# Patient Record
Sex: Female | Born: 1980 | Race: Black or African American | Hispanic: No | Marital: Single | Smoking: Never smoker
Health system: Southern US, Community
[De-identification: ages and names within clinical notes are randomized; demographics above are authoritative.]

## PROBLEM LIST (undated history)

## (undated) HISTORY — PX: TUBAL LIGATION: SHX77

---

## 2010-06-04 ENCOUNTER — Inpatient Hospital Stay (HOSPITAL_COMMUNITY)
Admission: AD | Admit: 2010-06-04 | Discharge: 2010-06-04 | Disposition: A | Discharge status: Home / Self Care | Payer: Self-pay | Source: Ambulatory Visit | Attending: Family Medicine | Admitting: Family Medicine

## 2010-06-04 DIAGNOSIS — N390 Urinary tract infection, site not specified: Secondary | ICD-10-CM

## 2010-06-04 DIAGNOSIS — B373 Candidiasis of vulva and vagina: Secondary | ICD-10-CM

## 2010-06-04 DIAGNOSIS — N949 Unspecified condition associated with female genital organs and menstrual cycle: Secondary | ICD-10-CM | POA: Insufficient documentation

## 2010-06-04 DIAGNOSIS — B3731 Acute candidiasis of vulva and vagina: Secondary | ICD-10-CM | POA: Insufficient documentation

## 2010-06-04 LAB — URINALYSIS, ROUTINE W REFLEX MICROSCOPIC
Bilirubin Urine: NEGATIVE
Specific Gravity, Urine: 1.01 (ref 1.005–1.030)
pH: 7.5 (ref 5.0–8.0)

## 2010-06-04 LAB — URINE MICROSCOPIC-ADD ON

## 2010-06-04 LAB — WET PREP, GENITAL: Trich, Wet Prep: NONE SEEN

## 2010-06-06 LAB — URINE CULTURE: Colony Count: >=100000

## 2012-01-10 ENCOUNTER — Emergency Department (HOSPITAL_COMMUNITY)
Admission: EM | Admit: 2012-01-10 | Discharge: 2012-01-11 | Disposition: A | Discharge status: Home / Self Care | Payer: Self-pay | Attending: Emergency Medicine | Admitting: Emergency Medicine

## 2012-01-10 ENCOUNTER — Encounter (HOSPITAL_COMMUNITY): Payer: Self-pay | Admitting: *Deleted

## 2012-01-10 DIAGNOSIS — R42 Dizziness and giddiness: Secondary | ICD-10-CM | POA: Insufficient documentation

## 2012-01-10 LAB — GLUCOSE, CAPILLARY: Glucose-Capillary: 108 mg/dL — ABNORMAL HIGH (ref 70–99)

## 2012-01-10 NOTE — ED Notes (Signed)
The pt has had dizziness and nausea since yesterday.  Headache since yesterday afternoone

## 2012-01-10 NOTE — ED Notes (Signed)
Patient complaining of dizziness upon waking up at 2am yesterday.  Patient reports that dizziness has been intermittent throughout the day, especially after she lies down and gets up.  Patient's family member gave patient over the counter motion sickness medication, which provided little to no relief.  Reports nausea; denies vomiting.  Denies shortness of breath, chest pain, and unsteady gait.  Patient speaks little [place]; family member provided history; interpreter phones at bedside.  Patient alert and oriented x4; PERRL present.  Will continue to monitor.

## 2012-01-11 MED ORDER — MECLIZINE HCL 25 MG PO TABS
50.0000 mg | ORAL_TABLET | Freq: Once | ORAL | Status: AC
  Administered 2012-01-11: 50 mg via ORAL
  Filled 2012-01-11: qty 2

## 2012-01-11 MED ORDER — MECLIZINE HCL 50 MG PO TABS: 50.0000 mg | ORAL_TABLET | Freq: Three times a day (TID) | ORAL | Status: DC | PRN

## 2012-01-11 MED ORDER — MECLIZINE HCL 12.5 MG PO TABS: 50.0000 mg | ORAL_TABLET | Freq: Three times a day (TID) | ORAL | Status: DC | PRN

## 2012-01-11 NOTE — ED Provider Notes (Addendum)
History     CSN: [id number]  Arrival date & time 01/10/12  1945   First MD Initiated Contact with Patient 01/11/12 0013      Chief Complaint  Patient presents with  . Dizziness   Patient speaks no English history is obtained from patient using [employer] interpreter line (Consider location/radiation/quality/duration/timing/severity/associated sxs/prior treatment) HPI Patient developed dizziness i.e. feeling of room spinning onset 2 PM yesterday. Symptoms intermittent lasting a few seconds at a time Symptoms worse with moving had improved with remaining still. No other associated symptoms no nausea no chest pain no headache. Treated with over-the-counter motion sickness medicine this afternoon with minimal relief. History reviewed. No pertinent past medical history. Past medical history negative History reviewed. No pertinent past surgical history.  No family history on file.  History  Substance Use Topics  . Smoking status: Never Smoker   . Smokeless tobacco: Not on file  . Alcohol Use: No    OB History    Grav Para Term Preterm Abortions TAB SAB Ect Mult Living                  Review of Systems  Constitutional: Negative.   HENT: Negative.   Respiratory: Negative.   Cardiovascular: Negative.   Gastrointestinal: Negative.   Musculoskeletal: Negative.   Skin: Negative.   Neurological: Positive for dizziness.  Hematological: Negative.   Psychiatric/Behavioral: Negative.   All other systems reviewed and are negative.    Allergies  Review of patient's allergies indicates no known allergies.  Home Medications  No current outpatient prescriptions on file.  BP 104/70  Pulse 74  Temp 98.4 F (36.9 C) (Oral)  Resp 20  SpO2 100%  LMP 12/24/2011  Physical Exam  Nursing note and vitals reviewed. Constitutional: She appears well-developed and well-nourished.  HENT:  Head: Normocephalic and atraumatic.  Eyes: Conjunctivae normal are normal. Pupils are equal,  round, and reactive to light.  Neck: Neck supple. No tracheal deviation present. No thyromegaly present.  Cardiovascular: Normal rate and regular rhythm.   No murmur heard. Pulmonary/Chest: Effort normal and breath sounds normal.  Abdominal: Soft. Bowel sounds are normal. She exhibits no distension. There is no tenderness.  Musculoskeletal: Normal range of motion. She exhibits no edema and no tenderness.  Neurological: She is alert. She has normal reflexes. Coordination normal.       BeComes vertiginous upon standing lasting a few seconds. Symptoms resolved after she stares and affixed point. Gait normal Romberg normal pronator drift normal  Skin: Skin is warm and dry. No rash noted.  Psychiatric: She has a normal mood and affect.    ED Course  Procedures (including critical care time)  Labs Reviewed  GLUCOSE, CAPILLARY - Abnormal; Notable for the following:    Glucose-Capillary 108 (*)     All other components within normal limits   No results found.   No diagnosis found.   Date: 01/11/2012  Rate: 80  Rhythm: normal sinus rhythm  QRS Axis: normal  Intervals: normal  ST/T Wave abnormalities: normal  Conduction Disutrbances: none  Narrative Interpretation: unremarkable   Results for orders placed during the hospital encounter of 01/10/12  GLUCOSE, CAPILLARY      Component Value Range   Glucose-Capillary 108 (*) 70 - 99 mg/dL   No results found.   MDM  History and exam is consistent with benign positional vertigo Plan prescription meclizine. ENT referral when necessary one week Diagnosis vertigo        Doug Sou, MD 01/11/12 0110  Doug Sou, MD 01/11/12 0110

## 2012-01-11 NOTE — ED Notes (Signed)
EDP at bedside with interpreter phones.

## 2012-01-11 NOTE — ED Notes (Signed)
Patient currently resting quietly in bed; no respiratory or acute distress noted.  Patient updated on plan of care; informed patient that we are currently waiting on disposition from EDP. Denies needs at this time; will continue to monitor.

## 2013-11-01 ENCOUNTER — Ambulatory Visit (INDEPENDENT_AMBULATORY_CARE_PROVIDER_SITE_OTHER): Payer: 59 | Admitting: Physician Assistant

## 2013-11-01 VITALS — BP 100/60 | HR 86 | Temp 97.9°F | Resp 16 | Ht 66.0 in | Wt 133.0 lb

## 2013-11-01 DIAGNOSIS — J302 Other seasonal allergic rhinitis: Secondary | ICD-10-CM

## 2013-11-01 MED ORDER — FLUTICASONE PROPIONATE 50 MCG/ACT NA SUSP: 2.0000 | Freq: Every day | NASAL | Status: DC

## 2013-11-01 MED ORDER — LORATADINE 10 MG PO TABS: 10.0000 mg | ORAL_TABLET | Freq: Every day | ORAL | Status: DC

## 2013-11-01 NOTE — Progress Notes (Signed)
     IDENTIFYING INFORMATION  Roberta Drake / female / 12/25/1980 / 33 y.o. / MRN: [id number]  The patient has no on her problem list.  SUBJECTIVE  Roberta Drake complains of Facial Pain.  HPI  Her facial pain started five days ago. This started in her nose with congestion and then spread, causing HA which is bilateral and constant in nature, and ear pressure.  She reports her pain at 8/10 and describes is as sharp.  She has tried 2 Advil in 24 hours, and it gives her relief for about 24 hours.  She reports itchy eys, has had minimal sneezing, and denies sore throat. She reports tactile fever.  She has never tried pseudoephedrine, Claritin, Flonase, or Zyrtec.      The patient has a current medication list which includes the following prescription(s): ibuprofen  Roberta Drake has No Known Allergies. and she  reports that she has never smoked. She reports that she does not drink alcohol or use illicit drugs.   The patient  has past surgical history that includes Oophorectomy. and her family is healthy to her knowledge.    Review of Systems  Constitutional: Positive for fever.  HENT: Positive for ear pain (pressure).   Respiratory: Negative.   Cardiovascular: Negative.   Gastrointestinal: Negative.   Genitourinary: Negative.   Musculoskeletal: Negative.   Skin: Negative.   Neurological: Positive for headaches.    OBJECTIVE  Blood pressure 100/60, pulse 86, temperature 97.9 F (36.6 C), temperature source Oral, resp. rate 16, height 5\' 6"  (1.676 m), weight 133 lb (60.328 kg), last menstrual period 10/17/2013, SpO2 100.00%.  Physical Exam  Constitutional: She is oriented to person, place, and time and well-developed, well-nourished, and in no distress.  HENT:  Head: Normocephalic.  Right Ear: Hearing, tympanic membrane, external ear and ear canal normal.  Left Ear: Hearing, tympanic membrane, external ear and ear canal normal.  Nose: Mucosal edema (L>R) and sinus tenderness  present. No rhinorrhea or nasal deformity. Right sinus exhibits maxillary sinus tenderness and frontal sinus tenderness. Left sinus exhibits maxillary sinus tenderness and frontal sinus tenderness.  Mouth/Throat: Uvula is midline and mucous membranes are normal. Posterior oropharyngeal erythema (Mild) present.  Neck: Normal range of motion.  Cardiovascular: Normal rate, regular rhythm and normal heart sounds.   Pulmonary/Chest: Effort normal and breath sounds normal.  Lymphadenopathy:       Head (right side): No submental, no submandibular, no tonsillar, no preauricular, no posterior auricular and no occipital adenopathy present.       Head (left side): No submental, no submandibular, no tonsillar, no preauricular, no posterior auricular and no occipital adenopathy present.    She has no cervical adenopathy.  Neurological: She is alert and oriented to person, place, and time.  Skin: Skin is warm and dry. She is not diaphoretic.  Psychiatric: Mood, memory, affect and judgment normal.    ASSESSMENT & PLAN  Seasonal allergies - Plan: fluticasone (FLONASE) 50 MCG/ACT nasal spray, loratadine (CLARITIN) 10 MG tablet.  Patient educated on the cause of seasonal allergies.  Specific instruction given regarding fluticasone administration, and that it is a preventative medication designed to be taken prophylactically. Advised a 7 days course of Claritin and Advil, as prescribed.      The patient was instructed to to call or comeback to clinic as needed, or should symptoms warrant.  Deliah BostonMichael Clark, MS, PA-C [facility] 11/01/2013 10:15 AM

## 2013-11-01 NOTE — Patient Instructions (Signed)
Take two Advil in the morning, and two Advil at night.

## 2013-12-21 ENCOUNTER — Ambulatory Visit (INDEPENDENT_AMBULATORY_CARE_PROVIDER_SITE_OTHER): Payer: 59 | Admitting: Family Medicine

## 2013-12-21 ENCOUNTER — Encounter: Payer: Self-pay | Admitting: Family Medicine

## 2013-12-21 VITALS — BP 106/72 | HR 87 | Temp 98.2°F | Resp 16 | Ht 65.0 in | Wt 129.2 lb

## 2013-12-21 DIAGNOSIS — R8290 Unspecified abnormal findings in urine: Secondary | ICD-10-CM

## 2013-12-21 DIAGNOSIS — N644 Mastodynia: Secondary | ICD-10-CM

## 2013-12-21 DIAGNOSIS — N912 Amenorrhea, unspecified: Secondary | ICD-10-CM

## 2013-12-21 DIAGNOSIS — A499 Bacterial infection, unspecified: Secondary | ICD-10-CM

## 2013-12-21 DIAGNOSIS — N760 Acute vaginitis: Secondary | ICD-10-CM

## 2013-12-21 DIAGNOSIS — R5383 Other fatigue: Secondary | ICD-10-CM

## 2013-12-21 DIAGNOSIS — R1031 Right lower quadrant pain: Secondary | ICD-10-CM

## 2013-12-21 DIAGNOSIS — B9689 Other specified bacterial agents as the cause of diseases classified elsewhere: Secondary | ICD-10-CM

## 2013-12-21 DIAGNOSIS — Z3169 Encounter for other general counseling and advice on procreation: Secondary | ICD-10-CM

## 2013-12-21 DIAGNOSIS — Z124 Encounter for screening for malignant neoplasm of cervix: Secondary | ICD-10-CM

## 2013-12-21 DIAGNOSIS — R1032 Left lower quadrant pain: Secondary | ICD-10-CM

## 2013-12-21 LAB — POCT WET PREP WITH KOH
Clue Cells Wet Prep HPF POC: 50
KOH Prep POC: NEGATIVE
Trichomonas, UA: NEGATIVE
Yeast Wet Prep HPF POC: NEGATIVE

## 2013-12-21 LAB — CBC
HCT: 37.6 % (ref 36.0–46.0)
Hemoglobin: 13.1 g/dL (ref 12.0–15.0)
MCH: 32.6 pg (ref 26.0–34.0)
MCHC: 34.8 g/dL (ref 30.0–36.0)
MCV: 93.5 fL (ref 78.0–100.0)
MPV: 10.3 fL (ref 9.4–12.4)
Platelets: 402 10*3/uL — ABNORMAL HIGH (ref 150–400)
RBC: 4.02 MIL/uL (ref 3.87–5.11)
RDW: 13.8 % (ref 11.5–15.5)
WBC: 5 10*3/uL (ref 4.0–10.5)

## 2013-12-21 LAB — POCT URINALYSIS DIPSTICK
Bilirubin, UA: NEGATIVE
Glucose, UA: NEGATIVE
Ketones, UA: NEGATIVE
Nitrite, UA: NEGATIVE
Protein, UA: NEGATIVE
Spec Grav, UA: <=1.005
Urobilinogen, UA: 0.2
pH, UA: 6

## 2013-12-21 LAB — POCT UA - MICROSCOPIC ONLY
Casts, Ur, LPF, POC: NEGATIVE
Crystals, Ur, HPF, POC: NEGATIVE
Mucus, UA: NEGATIVE
Yeast, UA: NEGATIVE

## 2013-12-21 LAB — COMPLETE METABOLIC PANEL WITHOUT GFR
Albumin: 4.2 g/dL (ref 3.5–5.2)
Alkaline Phosphatase: 64 U/L (ref 39–117)
BUN: 7 mg/dL (ref 6–23)
CO2: 29 meq/L (ref 19–32)
Calcium: 9.5 mg/dL (ref 8.4–10.5)
Chloride: 105 meq/L (ref 96–112)
GFR, Est Non African American: >89 mL/min
Glucose, Bld: 108 mg/dL — ABNORMAL HIGH (ref 70–99)
Potassium: 3.8 meq/L (ref 3.5–5.3)
Sodium: 139 meq/L (ref 135–145)
Total Protein: 7.1 g/dL (ref 6.0–8.3)

## 2013-12-21 LAB — COMPLETE METABOLIC PANEL WITH GFR
ALT: 14 U/L (ref 0–35)
AST: 18 U/L (ref 0–37)
Creat: 0.72 mg/dL (ref 0.50–1.10)
GFR, Est African American: >89 mL/min
Total Bilirubin: 0.5 mg/dL (ref 0.2–1.2)

## 2013-12-21 LAB — POCT URINE PREGNANCY: Preg Test, Ur: NEGATIVE

## 2013-12-21 MED ORDER — METRONIDAZOLE 500 MG PO TABS: 500.0000 mg | ORAL_TABLET | Freq: Two times a day (BID) | ORAL | Status: DC

## 2013-12-21 NOTE — Patient Instructions (Signed)
Bacterial Vaginosis Bacterial vaginosis is a vaginal infection that occurs when the normal balance of bacteria in the vagina is disrupted. It results from an overgrowth of certain bacteria. This is the most common vaginal infection in women of childbearing age. Treatment is important to prevent complications, especially in pregnant women, as it can cause a premature delivery. CAUSES  Bacterial vaginosis is caused by an increase in harmful bacteria that are normally present in smaller amounts in the vagina. Several different kinds of bacteria can cause bacterial vaginosis. However, the reason that the condition develops is not fully understood. RISK FACTORS Certain activities or behaviors can put you at an increased risk of developing bacterial vaginosis, including:  Having a new sex partner or multiple sex partners.  Douching.  Using an intrauterine device (IUD) for contraception. Women do not get bacterial vaginosis from toilet seats, bedding, swimming pools, or contact with objects around them. SIGNS AND SYMPTOMS  Some women with bacterial vaginosis have no signs or symptoms. Common symptoms include:  Grey vaginal discharge.  A fishlike odor with discharge, especially after sexual intercourse.  Itching or burning of the vagina and vulva.  Burning or pain with urination. DIAGNOSIS  Your health care provider will take a medical history and examine the vagina for signs of bacterial vaginosis. A sample of vaginal fluid may be taken. Your health care provider will look at this sample under a microscope to check for bacteria and abnormal cells. A vaginal pH test may also be done.  TREATMENT  Bacterial vaginosis may be treated with antibiotic medicines. These may be given in the form of a pill or a vaginal cream. A second round of antibiotics may be prescribed if the condition comes back after treatment.  HOME CARE INSTRUCTIONS   Only take over-the-counter or prescription medicines as  directed by your health care provider.  If antibiotic medicine was prescribed, take it as directed. Make sure you finish it even if you start to feel better.  Do not have sex until treatment is completed.  Tell all sexual partners that you have a vaginal infection. They should see their health care provider and be treated if they have problems, such as a mild rash or itching.  Practice safe sex by using condoms and only having one sex partner. SEEK MEDICAL CARE IF:   Your symptoms are not improving after 3 days of treatment.  You have increased discharge or pain.  You have a fever. MAKE SURE YOU:   Understand these instructions.  Will watch your condition.  Will get help right away if you are not doing well or get worse. FOR MORE INFORMATION  Centers for Disease Control and Prevention, Division of STD Prevention: www.cdc.gov/std American Sexual Health Association (ASHA): www.ashastd.org  Document Released: 01/04/2005 Document Revised: 10/25/2012 Document Reviewed: 08/16/2012 ExitCare Patient Information 2015 [employer]. This information is not intended to replace advice given to you by your health care provider. Make sure you discuss any questions you have with your health care provider.  

## 2013-12-21 NOTE — Progress Notes (Signed)
Chief Complaint:  Chief Complaint  Patient presents with  . establish care  . pain underneath stomach    x 3 months, numbness down legs x 2 wks  . pain both breast x 2 wks    HPI: Roberta Drake is a 33 y.o. female who is here for several different problems.  1. She is From [place], here with husband who is translating, she is G2A2L0 March 2012 she had a tubal pregnancy and had to have one of her fallopian tubes tied and an [facility] [place] She would like to get pregnant.  2. She had pain in her stomach in the pelvic area for 3 months , no vaginal discahrge, no fevers or chills, n urinary is issues. More at night time, does correlate with her periods and usually before and after, LMP was  Nov 1, Was taking ibuprofen without relief  3. 2 week hx of breast tenderness, She feels not well, she does not feel like herself,. she has nausea. She doe snot use a condom with sex. She wears n underwire bra, the underwire is not new but this bra is new.     History reviewed. No pertinent past medical history. Past Surgical History  Procedure Laterality Date  . Oophorectomy    . Tubal ligation     History   Social History  . Marital Status: Single    Spouse Name: N/A    Number of Children: N/A  . Years of Education: N/A   Social History Main Topics  . Smoking status: Never Smoker   . Smokeless tobacco: None  . Alcohol Use: No  . Drug Use: No  . Sexual Activity: Yes    Birth Control/ Protection: None   Other Topics Concern  . None   Social History Narrative   History reviewed. No pertinent family history. No Known Allergies Prior to Admission medications   Medication Sig Start Date End Date Taking? Authorizing Provider  fluticasone (FLONASE) 50 MCG/ACT nasal spray Place 2 sprays into both nostrils daily. Patient not taking: Reported on 12/21/2013 11/01/13   Dolores LoryMichael Lee Clark, PA-C  Ibuprofen (ADVIL) 200 MG CAPS Take by mouth.     Historical Provider, MD  loratadine (CLARITIN) 10 MG tablet Take 1 tablet (10 mg total) by mouth daily. Patient not taking: Reported on 12/21/2013 11/01/13   Dolores LoryMichael Lee Clark, PA-C     ROS: The patient denies fevers, chills, night sweats, unintentional weight loss, chest pain, palpitations, wheezing, dyspnea on exertion,  vomiting, abdominal pain, dysuria, hematuria, melena, numbness, weakness, or tingling.   All other systems have been reviewed and were otherwise negative with the exception of those mentioned in the HPI and as above.    PHYSICAL EXAM: Filed Vitals:   12/21/13 1046  BP: 106/72  Pulse: 87  Temp: 98.2 F (36.8 C)  Resp: 16   Filed Vitals:   12/21/13 1046  Height: 5\' 5"  (1.651 m)  Weight: 129 lb 3.2 oz (58.605 kg)   Body mass index is 21.5 kg/(m^2).  General: Alert, no acute distress HEENT:  Normocephalic, atraumatic, oropharynx patent. EOMI, PERRLA Cardiovascular:  Regular rate and rhythm, no rubs murmurs or gallops.  No Carotid bruits, radial pulse intact. No pedal edema.  Respiratory: Clear to auscultation bilaterally.  No wheezes, rales, or rhonchi.  No cyanosis, no use of accessory musculature GI: No organomegaly, abdomen is soft and non-tender, positive bowel sounds.  No masses. Skin: No rashes.  Neurologic: Facial musculature symmetric. Psychiatric: Patient is appropriate throughout our interaction. Lymphatic: No cervical lymphadenopathy Musculoskeletal: Gait intact. GU-vaginal dc, thick white, nonodoruous, minimally friable area of cervical os Breast exam normal   LABS: Results for orders placed or performed in visit on 12/21/13  POCT UA - Microscopic Only  Result Value Ref Range   WBC, Ur, HPF, POC 0-1    RBC, urine, microscopic 1-2    Bacteria, U Microscopic small    Mucus, UA neg    Epithelial cells, urine per micros 2-15    Crystals, Ur, HPF, POC neg    Casts, Ur, LPF, POC neg    Yeast, UA neg   POCT urinalysis dipstick  Result Value Ref  Range   Color, UA yellow    Clarity, UA clear    Glucose, UA neg    Bilirubin, UA neg    Ketones, UA neg    Spec Grav, UA <=1.005    Blood, UA trace-intact    pH, UA 6.0    Protein, UA neg    Urobilinogen, UA 0.2    Nitrite, UA neg    Leukocytes, UA Trace   POCT urine pregnancy  Result Value Ref Range   Preg Test, Ur Negative   POCT Wet Prep with KOH  Result Value Ref Range   Trichomonas, UA Negative    Clue Cells Wet Prep HPF POC 50%    Epithelial Wet Prep HPF POC 12-20    Yeast Wet Prep HPF POC neg    Bacteria Wet Prep HPF POC 3+    RBC Wet Prep HPF POC 0-2    WBC Wet Prep HPF POC 17-20    KOH Prep POC Negative      EKG/XRAY:   Primary [facility]   ASSESSMENT/PLAN: Encounter Diagnoses  Name Primary?  . Breast tenderness in female Yes  . Bilateral lower abdominal pain   . Amenorrhea   . Screening for cervical cancer   . Other fatigue   . Bacterial vaginosis   . Abnormal urine   . Infertility counseling    Refer to inferitlity ob since she and her husband have been together for 4 years and they have tried to get pregnant but have not been successful, recent tubal pregnancy. [facility] Labs pending Rx Flagyl BID x 7 days, urine cx pending F/u prn otherwise for annual visit   Gross sideeffects, risk and benefits, and alternatives of medications d/w patient. Patient is aware that all medications have potential sideeffects and we are unable to predict every sideeffect or drug-drug interaction that may occur.  Hamilton CapriLE, Casara Perrier PHUONG, DO 12/21/2013 4:17 PM

## 2013-12-23 LAB — URINE CULTURE: Colony Count: 45000

## 2013-12-25 LAB — PAP IG, CT-NG, RFX HPV ASCU
Chlamydia Probe Amp: NEGATIVE
GC Probe Amp: NEGATIVE

## 2014-01-04 ENCOUNTER — Encounter: Payer: Self-pay | Admitting: Family Medicine

## 2014-01-04 ENCOUNTER — Ambulatory Visit (INDEPENDENT_AMBULATORY_CARE_PROVIDER_SITE_OTHER): Payer: 59 | Admitting: Family Medicine

## 2014-01-04 VITALS — BP 110/70 | HR 76 | Temp 98.2°F | Resp 16 | Ht 64.5 in | Wt 130.8 lb

## 2014-01-04 DIAGNOSIS — Z2821 Immunization not carried out because of patient refusal: Secondary | ICD-10-CM

## 2014-01-04 DIAGNOSIS — Z13220 Encounter for screening for lipoid disorders: Secondary | ICD-10-CM

## 2014-01-04 DIAGNOSIS — Z1329 Encounter for screening for other suspected endocrine disorder: Secondary | ICD-10-CM

## 2014-01-04 DIAGNOSIS — Z113 Encounter for screening for infections with a predominantly sexual mode of transmission: Secondary | ICD-10-CM

## 2014-01-04 DIAGNOSIS — R87612 Low grade squamous intraepithelial lesion on cytologic smear of cervix (LGSIL): Secondary | ICD-10-CM

## 2014-01-04 DIAGNOSIS — Z0000 Encounter for general adult medical examination without abnormal findings: Secondary | ICD-10-CM

## 2014-01-04 NOTE — Patient Instructions (Signed)
Abnormal Pap Test Information During a Pap test, the cells on the surface of your cervix are checked to see if they look normal, abnormal, or if they show signs of having been altered by a certain type of virus called human papillomavirus, or HPV. Cervical cells that have been affected by HPV are called dysplasia. Dysplasia is not cancer, but describes abnormal cells found on the surface of the cervix. Depending on the degree of dysplasia, some of the cells may be considered pre-cancerous and may turn into cancer over time if follow up with a caregiver is delayed.  WHAT DOES AN ABNORMAL PAP TEST MEAN? Having an abnormal pap test does not mean that you have cancer. However, certain types of abnormal pap tests can be a sign that a person is at a higher risk of developing cancer. Your caregiver will want to do other tests to find out more about the abnormal cells. Your abnormal Pap test results could show:   Small and uncertain changes that should be carefully watched.   Cervical dysplasia that has caused mild changes and can be followed over time.  Cervical dysplasia that is more severe and needs to be followed and treated to ensure the problem goes away.  Cancer.  When severe cervical dysplasia is found and treated early, it rarely will grow into cancer.  WHAT WILL BE DONE ABOUT MY ABNORMAL PAP TEST?  A colposcopy may be needed. This is a procedure where your cervix is examined using light and magnification.  A small tissue sample of your cervix (biopsy) may need to be removed and then examined. This is often performed if there are areas that appear infected.  A sample of cells from the cervical canal may be removed with either a small brush or scraping instrument (curette). Based on the results of the procedures above, some caregivers may recommend either cryotherapy of the cervix or a surgical LEEP where a portion of the cervix is removed. LEEP is short for "loop electrical excisional  procedure." Rarely, a caregiver may recommend a cone biopsy.This is a procedure where a small, cone-shaped sample of your cervix is taken out. The part that is taken out is the area where the abnormal cells are.  WHAT IF I HAVE A DYSPLASIA OR A CANCER? You may be referred to a specialist. Radiation may also be a treatment for more advanced cancer. Having a hysterectomy is the last treatment option for dysplasia, but it is a more common treatment for someone with cancer. All treatment options will be discussed with you by your caregiver. WHAT SHOULD YOU DO AFTER BEING TREATED? If you have had an abnormal pap test, you should continue to have regular pap tests and check-ups as directed by your caregiver. Your cervical problem will be carefully watched so it does not get worse. Also, your caregiver can watch for, and treat, any new problems that may come up. Document Released: 04/21/2010 Document Revised: 05/01/2012 Document Reviewed: 12/31/2010 ExitCare Patient Information 2015 Exit[employer]. This information is not intended to replace advice given to you by your health care provider. Make sure you discuss any questions you have with your health care provider.  

## 2014-01-04 NOTE — Progress Notes (Signed)
Chief Complaint:  Chief Complaint  Patient presents with  . Annual Exam    with PAP SMEAR    HPI: Roberta Drake is a 33 y.o. female who is here for an annual visit, she was recently her e for another visit for pelvic pain and was treated for BV  and labs were done less than 2 weeks ago so will do limited testing today. 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 She is G2A2L0, LMP was 12/23/2013, she would like labs for an  annual She will see Dr Billy Coastaavon for Infertility issues with her husband, per the patient's husband she ahd a tubal pregnancy and ahd to have herleft tube removed but bot sure if she had an ovary removed or not as well.  [facility] She is doing well overall The breast tenderness she had also resolved with the end of her cycle and changing of her bra She is no longer having an pelvic pain.   History reviewed. No pertinent past medical history. Past Surgical History  Procedure Laterality Date  . Tubal ligation      left side only?    History   Social History  . Marital Status: Single    Spouse Name: N/A    Number of Children: N/A  . Years of Education: N/A   Social History Main Topics  . Smoking status: Never Smoker   . Smokeless tobacco: None  . Alcohol Use: No  . Drug Use: No  . Sexual Activity: Yes    Birth Control/ Protection: None   Other Topics Concern  . None   Social History Narrative   History reviewed. No pertinent family history. No Known Allergies Prior to Admission medications   Medication Sig Start Date End Date Taking? Authorizing Provider  fluticasone (FLONASE) 50 MCG/ACT nasal spray Place 2 sprays into both nostrils daily. Patient not taking: Reported on 12/21/2013 11/01/13   Dolores LoryMichael Lee Clark, PA-C  Ibuprofen (ADVIL) 200 MG CAPS Take by mouth.    Historical Provider, MD  loratadine (CLARITIN) 10 MG tablet Take 1 tablet (10  mg total) by mouth daily. Patient not taking: Reported on 12/21/2013 11/01/13   Dolores LoryMichael Lee Clark, PA-C  metroNIDAZOLE (FLAGYL) 500 MG tablet Take 1 tablet (500 mg total) by mouth 2 (two) times daily. Patient not taking: Reported on 01/04/2014 12/21/13   Debbrah Sampedro P Khloi Rawl, DO     ROS: The patient denies fevers, chills, night sweats, unintentional weight loss, chest pain, palpitations, wheezing, dyspnea on exertion, nausea, vomiting, abdominal pain, dysuria, hematuria, melena, numbness, weakness, or tingling.   All other systems have been reviewed and were otherwise negative with the exception of those mentioned in the HPI and as above.    PHYSICAL EXAM: Filed Vitals:   01/04/14 1414  BP: 110/70  Pulse: 76  Temp: 98.2 F (36.8 C)  Resp: 16   Filed Vitals:   01/04/14 1414  Height: 5' 4.5" (1.638 m)  Weight: 130 lb 12.8 oz (59.33 kg)   Body mass index is 22.11 kg/(m^2).  General: Alert, no acute distress HEENT:  Normocephalic, atraumatic, oropharynx patent. EOMI, PERRLA, no thyroid megaly Cardiovascular:  Regular rate and rhythm, no rubs murmurs or gallops.  No Carotid bruits, radial pulse intact. No pedal edema.  Respiratory: Clear to auscultation bilaterally.  No wheezes, rales, or rhonchi.  No cyanosis, no use of  accessory musculature GI: No organomegaly, abdomen is soft and non-tender, positive bowel sounds.  No masses. Skin: No rashes. Neurologic: Facial musculature symmetric. Psychiatric: Patient is appropriate throughout our interaction. Lymphatic: No cervical lymphadenopathy Musculoskeletal: Gait intact. Breast exam normal  LABS: Results for orders placed or performed in visit on 01/04/14  HIV antibody  Result Value Ref Range   HIV 1&2 Ab, 4th Generation NONREACTIVE NONREACTIVE  RPR  Result Value Ref Range   RPR NON REAC NON REAC  TSH  Result Value Ref Range   TSH 0.450 0.350 - 4.500 uIU/mL  LDL Cholesterol, Direct  Result Value Ref Range   Direct LDL 71 mg/dL      EKG/XRAY:   Primary read interpreted [facility] Con[facility]Le [facility].   ASSESSMENT/PLAN: Encounter Diagnoses  Name Primary?  . Annual physical exam Yes  . Screening for hyperlipidemia   . Screening for thyroid disorder   . Screening for STD (sexually transmitted disease)   . Low grade squamous intraepithelial lesion on cytologic smear of cervix (lgsil)   . Influenza vaccination declined    Roberta Drake is a pleasant G2A2L0 33 y.o [place] who is here with her husband who is translating. She has an appt with Dr Sherrell Pullerraavon for inferitlity and hopefully they can also examine her for LGSIL, will ask clinical staff to let Dr Sherrell Pullerraavon know about this as well Will defer pap since recent one showing LGSIL Will do limited labs since recenttly done: TSH, LDL ( she ate already) STD screening F/u prn.    Gross sideeffects, risk and benefits, and alternatives of medications d/w patient. Patient is aware that all medications have potential sideeffects and we are unable to predict every sideeffect or drug-drug interaction that may occur.  Hamilton CapriLE, Jennifier Smitherman PHUONG, DO 01/07/2014 5:46 PM

## 2014-01-05 LAB — TSH: TSH: 0.45 u[IU]/mL (ref 0.350–4.500)

## 2014-01-05 LAB — LDL CHOLESTEROL, DIRECT: Direct LDL: 71 mg/dL

## 2014-01-05 LAB — RPR

## 2014-01-05 LAB — HIV ANTIBODY (ROUTINE TESTING W REFLEX): HIV 1&2 Ab, 4th Generation: NONREACTIVE

## 2014-01-07 ENCOUNTER — Encounter: Payer: Self-pay | Admitting: Family Medicine

## 2014-01-29 ENCOUNTER — Other Ambulatory Visit (HOSPITAL_COMMUNITY): Payer: Self-pay | Admitting: Obstetrics and Gynecology

## 2014-01-29 DIAGNOSIS — N979 Female infertility, unspecified: Secondary | ICD-10-CM

## 2014-02-05 ENCOUNTER — Ambulatory Visit (HOSPITAL_COMMUNITY)
Admission: RE | Admit: 2014-02-05 | Discharge: 2014-02-05 | Disposition: A | Discharge status: Home / Self Care | Payer: 59 | Source: Ambulatory Visit | Attending: Obstetrics and Gynecology | Admitting: Obstetrics and Gynecology

## 2014-02-05 DIAGNOSIS — N971 Female infertility of tubal origin: Secondary | ICD-10-CM | POA: Insufficient documentation

## 2014-02-05 DIAGNOSIS — N979 Female infertility, unspecified: Secondary | ICD-10-CM | POA: Diagnosis present

## 2014-02-05 MED ORDER — IOHEXOL 300 MG/ML  SOLN
20.0000 mL | Freq: Once | INTRAMUSCULAR | Status: AC | PRN
  Administered 2014-02-05: 20 mL

## 2014-10-28 ENCOUNTER — Telehealth: Payer: Self-pay | Admitting: Family Medicine

## 2014-10-28 NOTE — Telephone Encounter (Signed)
lmom with patient  new appt time on 01/06/15@9 :45

## 2015-01-06 ENCOUNTER — Encounter: Payer: Self-pay | Admitting: Family Medicine

## 2015-01-17 ENCOUNTER — Encounter: Payer: Self-pay | Admitting: Family Medicine

## 2015-12-03 IMAGING — RF DG HYSTEROGRAM
4 series · 4 of 4 positions shown · IV contrast (omnipaque)
Comparison: None.

FLUOROSCOPY TIME:  24 seconds

CLINICAL DATA: Infertility. History of surgery for ectopic
pregnancy, believed to be on the left.

EXAM:
HYSTEROSALPINGOGRAM
TECHNIQUE: Following cleansing of the cervix and vagina with Betadine solution,
a hysterosalpingogram was performed using a 5-French
hysterosalpingogram catheter and Omnipaque 300 contrast. The patient
tolerated the examination without difficulty.

[Series 1: run · 1 of 1 slices shown (1 of 4)]
[im 1/1]
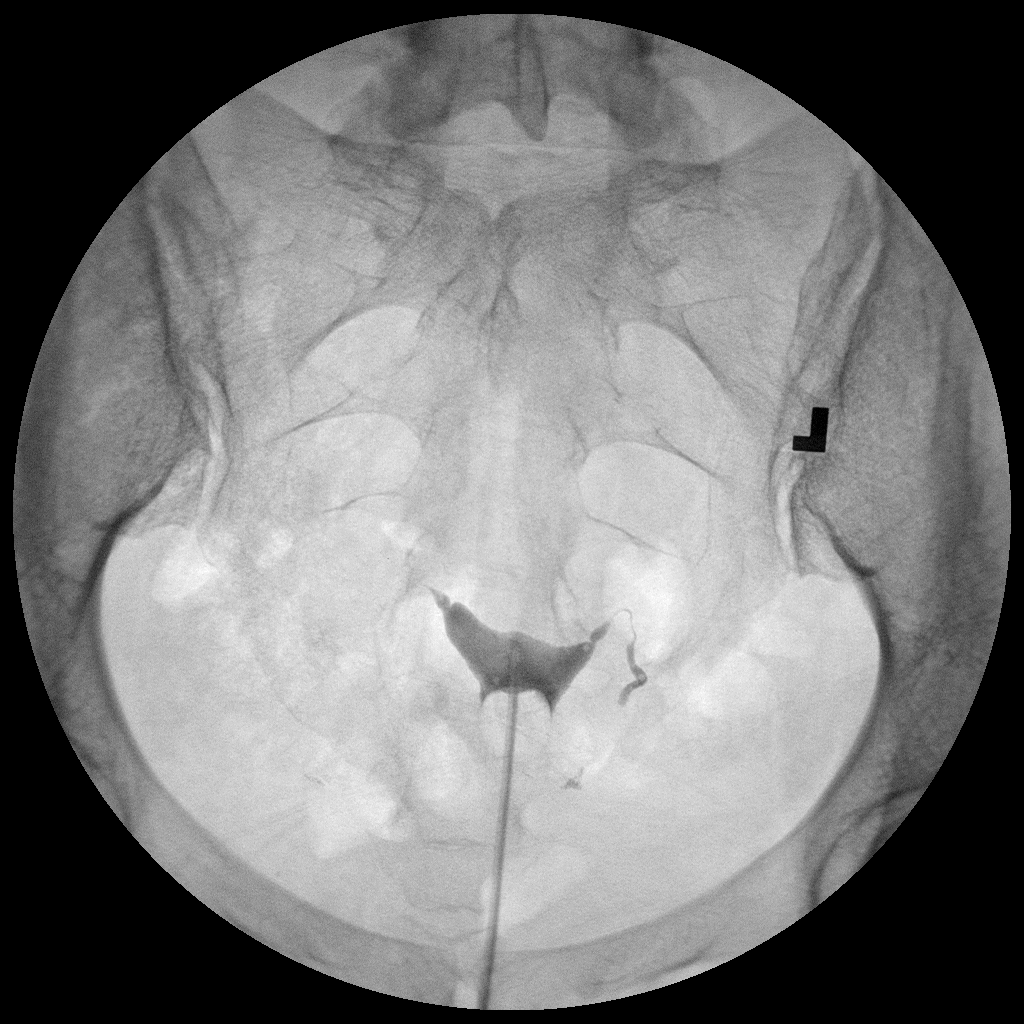

[Series 2: run · 1 of 1 slices shown (2 of 4)]
[im 1/1]
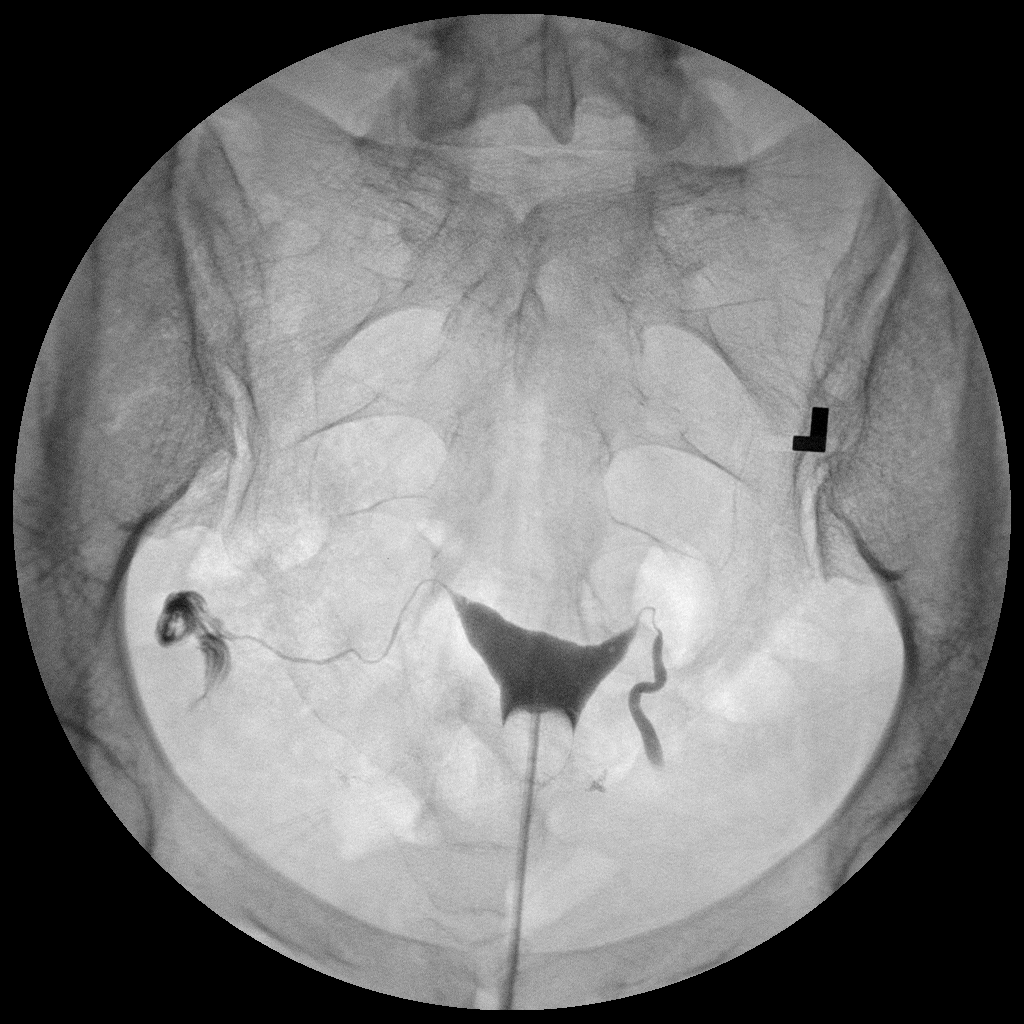

[Series 3: run · 1 of 1 slices shown (3 of 4)]
[im 1/1]
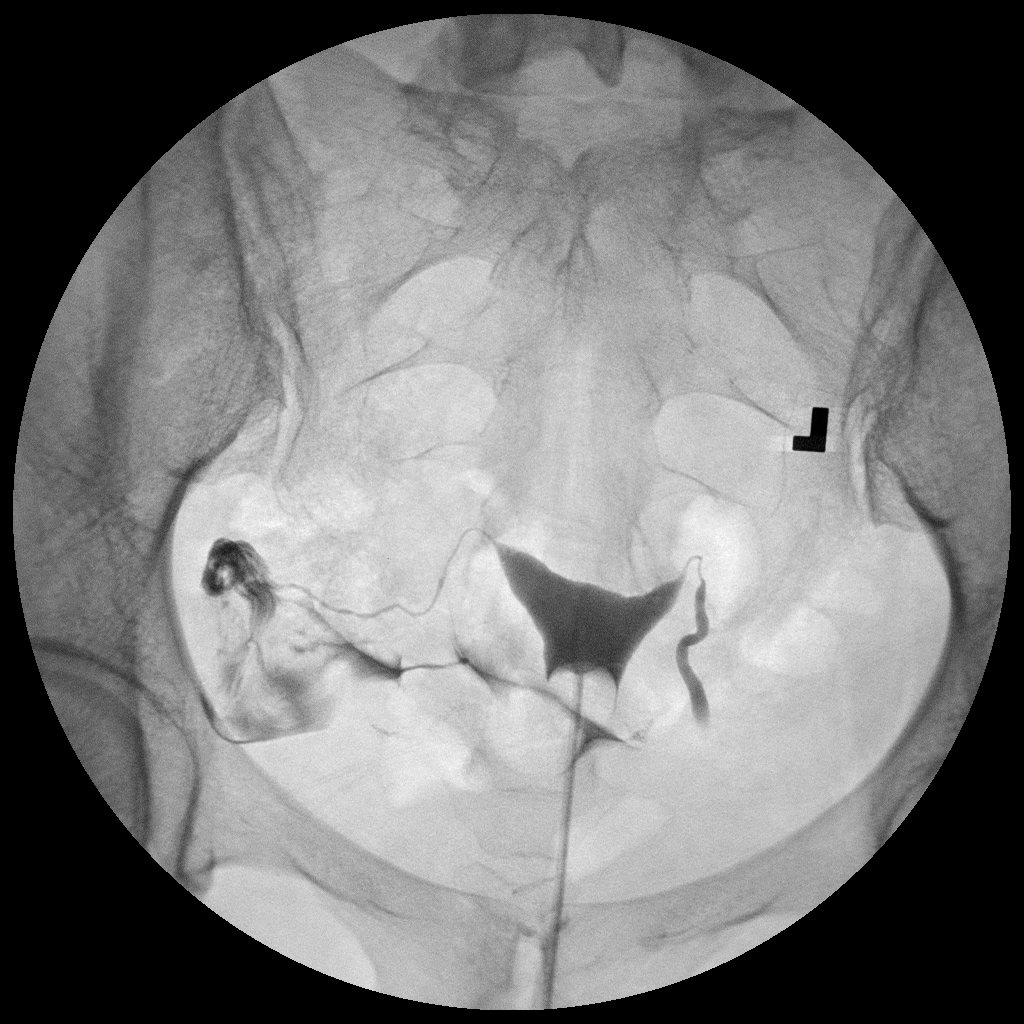

[Series 4: run · 1 of 1 slices shown (4 of 4)]
[im 1/1]
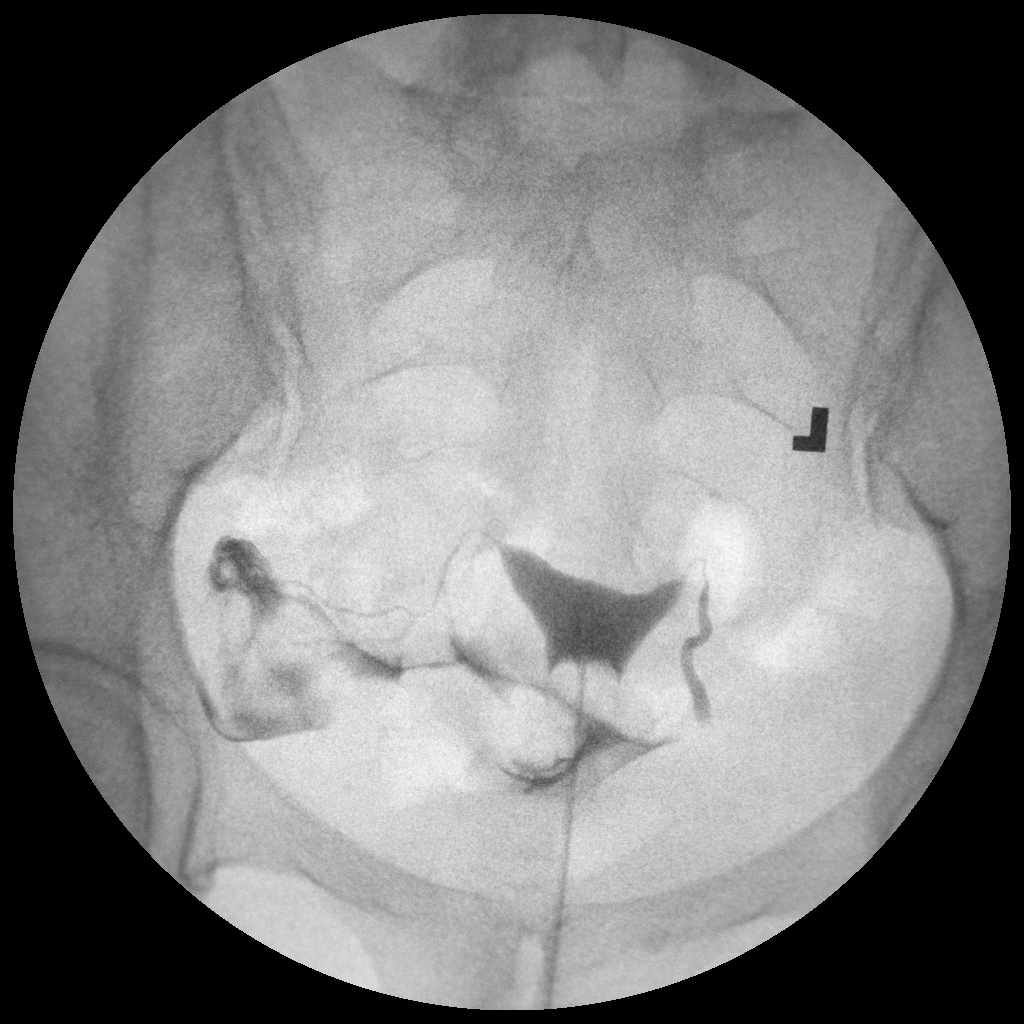

[4 of 4 positions shown; findings below may reference images not displayed]

FINDINGS: Endometrial cavity is normal. Normal appearance of the right
fallopian tube with normal spillage. The left fallopian tube is
occluded in its proximal to mid segment. No spillage from the left.
IMPRESSION: Normal right fallopian tube with normal spillage. Occlusion of the
left fallopian tube.
